# Patient Record
Sex: Male | Born: 1980 | Race: White | Hispanic: No | Marital: Married | Smoking: Former smoker
Health system: Southern US, Community
[De-identification: ages and names within clinical notes are randomized; demographics above are authoritative.]

## PROBLEM LIST (undated history)

## (undated) DIAGNOSIS — G4730 Sleep apnea, unspecified: Secondary | ICD-10-CM

## (undated) HISTORY — PX: ANKLE SURGERY: SHX546

## (undated) HISTORY — DX: Sleep apnea, unspecified: G47.30

---

## 2005-01-17 ENCOUNTER — Ambulatory Visit: Payer: Self-pay | Admitting: Unknown Physician Specialty

## 2006-11-19 ENCOUNTER — Emergency Department: Payer: Self-pay | Admitting: Emergency Medicine

## 2011-06-12 ENCOUNTER — Emergency Department: Payer: Self-pay

## 2015-10-16 ENCOUNTER — Encounter: Payer: Self-pay | Admitting: Emergency Medicine

## 2015-10-16 ENCOUNTER — Emergency Department: Payer: Managed Care, Other (non HMO)

## 2015-10-16 ENCOUNTER — Emergency Department
Admission: EM | Admit: 2015-10-16 | Discharge: 2015-10-16 | Disposition: A | Discharge status: Home / Self Care | Payer: Managed Care, Other (non HMO) | Attending: Emergency Medicine | Admitting: Emergency Medicine

## 2015-10-16 DIAGNOSIS — S20212A Contusion of left front wall of thorax, initial encounter: Secondary | ICD-10-CM | POA: Insufficient documentation

## 2015-10-16 DIAGNOSIS — Y998 Other external cause status: Secondary | ICD-10-CM | POA: Insufficient documentation

## 2015-10-16 DIAGNOSIS — Y9289 Other specified places as the place of occurrence of the external cause: Secondary | ICD-10-CM | POA: Diagnosis not present

## 2015-10-16 DIAGNOSIS — S29001A Unspecified injury of muscle and tendon of front wall of thorax, initial encounter: Secondary | ICD-10-CM | POA: Diagnosis present

## 2015-10-16 DIAGNOSIS — F17200 Nicotine dependence, unspecified, uncomplicated: Secondary | ICD-10-CM | POA: Diagnosis not present

## 2015-10-16 DIAGNOSIS — S301XXA Contusion of abdominal wall, initial encounter: Secondary | ICD-10-CM | POA: Insufficient documentation

## 2015-10-16 DIAGNOSIS — Y9323 Activity, snow (alpine) (downhill) skiing, snow boarding, sledding, tobogganing and snow tubing: Secondary | ICD-10-CM | POA: Insufficient documentation

## 2015-10-16 MED ORDER — TRAMADOL HCL 50 MG PO TABS
50.0000 mg | ORAL_TABLET | Freq: Once | ORAL | Status: AC
  Administered 2015-10-16: 50 mg via ORAL
  Filled 2015-10-16: qty 1

## 2015-10-16 MED ORDER — IBUPROFEN 800 MG PO TABS
800.0000 mg | ORAL_TABLET | Freq: Once | ORAL | Status: AC
  Administered 2015-10-16: 800 mg via ORAL
  Filled 2015-10-16: qty 1

## 2015-10-16 MED ORDER — TRAMADOL HCL 50 MG PO TABS: 50.0000 mg | ORAL_TABLET | Freq: Four times a day (QID) | ORAL | Status: AC | PRN

## 2015-10-16 MED ORDER — IBUPROFEN 800 MG PO TABS: 800.0000 mg | ORAL_TABLET | Freq: Three times a day (TID) | ORAL | Status: AC | PRN

## 2015-10-16 NOTE — ED Provider Notes (Signed)
[facility] Emergency Department Provider Note  ____________________________________________  Time seen: Approximately 2:07 PM  I have reviewed the triage vital signs and the nursing notes.   HISTORY  Chief Complaint Fall    HPI Timothy Mendoza is a 35 y.o. male patient complaining the left frontal rib pain and left abdominal pain secondary to sledding incident yesterday. Patient has 2 episodes the first floor when he fell off a sled and the second one secondary to a twisting incident going up a hill. Patient states the pain increases with movement or deep breathing. Patient is noticed no bruising. Patient state on the second episode he felt a pop in the left rib area. No palliative measures taken for this complaint. Patient rates pain as a 7/10 at this time.   History reviewed. No pertinent past medical history.  There are no active problems to display for this patient.   History reviewed. No pertinent past surgical history.  Current Outpatient Rx  Name  Route  Sig  Dispense  Refill  . ibuprofen (ADVIL,MOTRIN) 800 MG tablet   Oral   Take 1 tablet (800 mg total) by mouth every 8 (eight) hours as needed.   30 tablet   0   . traMADol (ULTRAM) 50 MG tablet   Oral   Take 1 tablet (50 mg total) by mouth every 6 (six) hours as needed for moderate pain.   12 tablet   0     Allergies Review of patient's allergies indicates no known allergies.  No family history on file.  Social History Social History  Substance Use Topics  . Smoking status: Current Every Day Smoker  . Smokeless tobacco: None  . Alcohol Use: No    Review of Systems Constitutional: No fever/chills Eyes: No visual changes. ENT: No sore throat. Cardiovascular: Denies chest pain. Respiratory: Denies shortness of breath. Gastrointestinal: Left abdominal pain.  No nausea, no vomiting.  No diarrhea.  No constipation. Genitourinary: Negative for dysuria. Musculoskeletal: Left rib  pain . Skin: Negative for rash. Neurological: Negative for headaches, focal weakness or numbness. 10-point ROS otherwise negative.  ____________________________________________   PHYSICAL EXAM:  VITAL SIGNS: ED Triage Vitals  Enc Vitals Group     BP 10/16/15 1240 131/82 mmHg     Pulse Rate 10/16/15 1240 87     Resp 10/16/15 1240 18     Temp 10/16/15 1240 97.9 F (36.6 C)     Temp Source 10/16/15 1240 Oral     SpO2 10/16/15 1240 100 %     Weight 10/16/15 1240 185 lb (83.915 kg)     Height 10/16/15 1240 5\' 11"  (1.803 m)     Head Cir --      Peak Flow --      Pain Score 10/16/15 1205 7     Pain Loc --      Pain Edu? --      Excl. in GC? --     Constitutional: Alert and oriented. Well appearing and in no acute distress. Eyes: Conjunctivae are normal. PERRL. EOMI. Head: Atraumatic. Nose: No congestion/rhinnorhea. Mouth/Throat: Mucous membranes are moist.  Oropharynx non-erythematous. Neck: No stridor. No cervical spine tenderness to palpation. Hematological/Lymphatic/Immunilogical: No cervical lymphadenopathy. Cardiovascular: Normal rate, regular rhythm. Grossly normal heart sounds.  Good peripheral circulation. Respiratory: Normal respiratory effort.  No retractions. Lungs CTAB. Gastrointestinal guarding palpation left upper quadrant of the abdomen. No distention. No abdominal bruits. No CVA tenderness. Musculoskeletal: Guarding palpation 10th and 12th ribs on the left side.Marland Kitchen  No joint effusions. Neurologic:  Normal speech and language. No gross focal neurologic deficits are appreciated. No gait instability. Skin:  Skin is warm, dry and intact. No rash noted. Psychiatric: Mood and affect are normal. Speech and behavior are normal.  ____________________________________________   LABS (all labs ordered are listed, but only abnormal results are displayed)  Labs Reviewed - No data to  display ____________________________________________  EKG   ____________________________________________  RADIOLOGY  No acute findings on x-ray or ultrasound. ____________________________________________   PROCEDURES  Procedure(s) performed: None  Critical Care performed: No  ____________________________________________   INITIAL IMPRESSION / ASSESSMENT AND PLAN / ED COURSE  Pertinent labs & imaging results that were available during my care of the patient were reviewed by me and considered in my medical decision making (see chart for details).  Left rib contusion left abdomen contusion. Discussed x-ray  negative results with patient. Patient given discharge Instructions and a Prescription for Tramadol and Ibuprofen. Patient Advised Follow-Up Family Doctor If Complaint Continues. ____________________________________________   FINAL CLINICAL IMPRESSION(S) / ED DIAGNOSES  Final diagnoses:  Rib contusion, left, initial encounter  Abdominal contusion, initial encounter      Joni ReiningRonald K Almena Hokenson, PA-C 10/16/15 1521  Emily FilbertJonathan E Williams, MD 10/16/15 [phone]

## 2015-10-16 NOTE — ED Notes (Signed)
Pt with left rib pain. 0/10 when sitting still, 8/10 with movement or breathing, coughing. No bruising.

## 2015-10-16 NOTE — ED Notes (Signed)
Pt to ed with c/o fall yesterday on ice,  Pt states he felt a pop in left rib area.  Increased pain with movement today.

## 2015-10-16 NOTE — Discharge Instructions (Signed)
Chest Contusion °A contusion is a deep bruise. Bruises happen when an injury causes bleeding under the skin. Signs of bruising include pain, puffiness (swelling), and discolored skin. The bruise may turn blue, purple, or yellow.  °HOME CARE °· Put ice on the injured area. °¨ Put ice in a plastic bag. °¨ Place a towel between the skin and the bag. °¨ Leave the ice on for 15-20 minutes at a time, 03-04 times a day for the first 48 hours. °· Only take medicine as told by your doctor. °· Rest. °· Take deep breaths (deep-breathing exercises) as told by your doctor. °· Stop smoking if you smoke. °· Do not lift objects over 5 pounds (2.3 kilograms) for 3 days or longer if told by your doctor. °GET HELP RIGHT AWAY IF:  °· You have more bruising or puffiness. °· You have pain that gets worse. °· You have trouble breathing. °· You are dizzy, weak, or pass out (faint). °· You have blood in your pee (urine) or poop (stool). °· You cough up or throw up (vomit) blood. °· Your puffiness or pain is not helped with medicines. °MAKE SURE YOU:  °· Understand these instructions. °· Will watch your condition. °· Will get help right away if you are not doing well or get worse. °  °This information is not intended to replace advice given to you by your health care provider. Make sure you discuss any questions you have with your health care provider. °  °Document Released: 03/11/2008 Document Revised: 06/17/2012 Document Reviewed: 03/16/2012 °Elsevier Interactive Patient Education ©2016 [employer] ° °

## 2016-01-19 ENCOUNTER — Ambulatory Visit: Payer: Self-pay | Admitting: Family Medicine

## 2016-03-20 ENCOUNTER — Ambulatory Visit: Payer: Self-pay | Admitting: Family Medicine

## 2016-12-19 IMAGING — CR DG RIBS W/ CHEST 3+V*L*
3 series · 3 of 3 positions shown · non-contrast
Comparison: None.

CLINICAL DATA: Left rib pain, fell snowboarding yesterday

EXAM:
LEFT RIBS AND CHEST - 3+ VIEW

[chest pa]
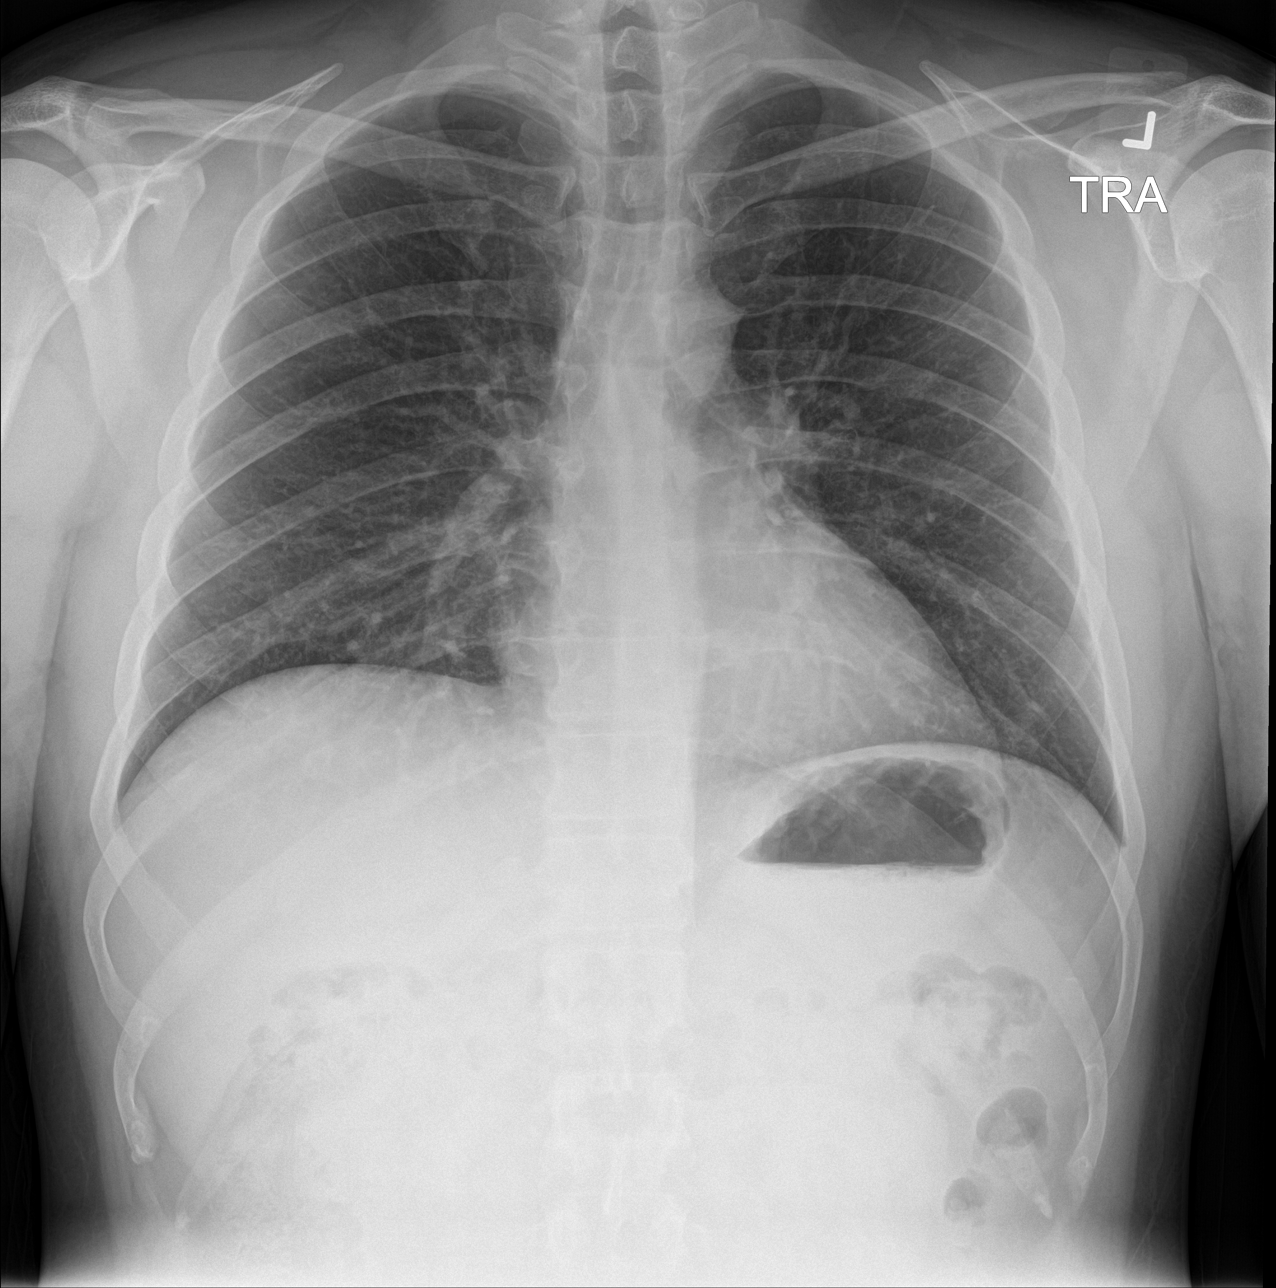

[rib pa]
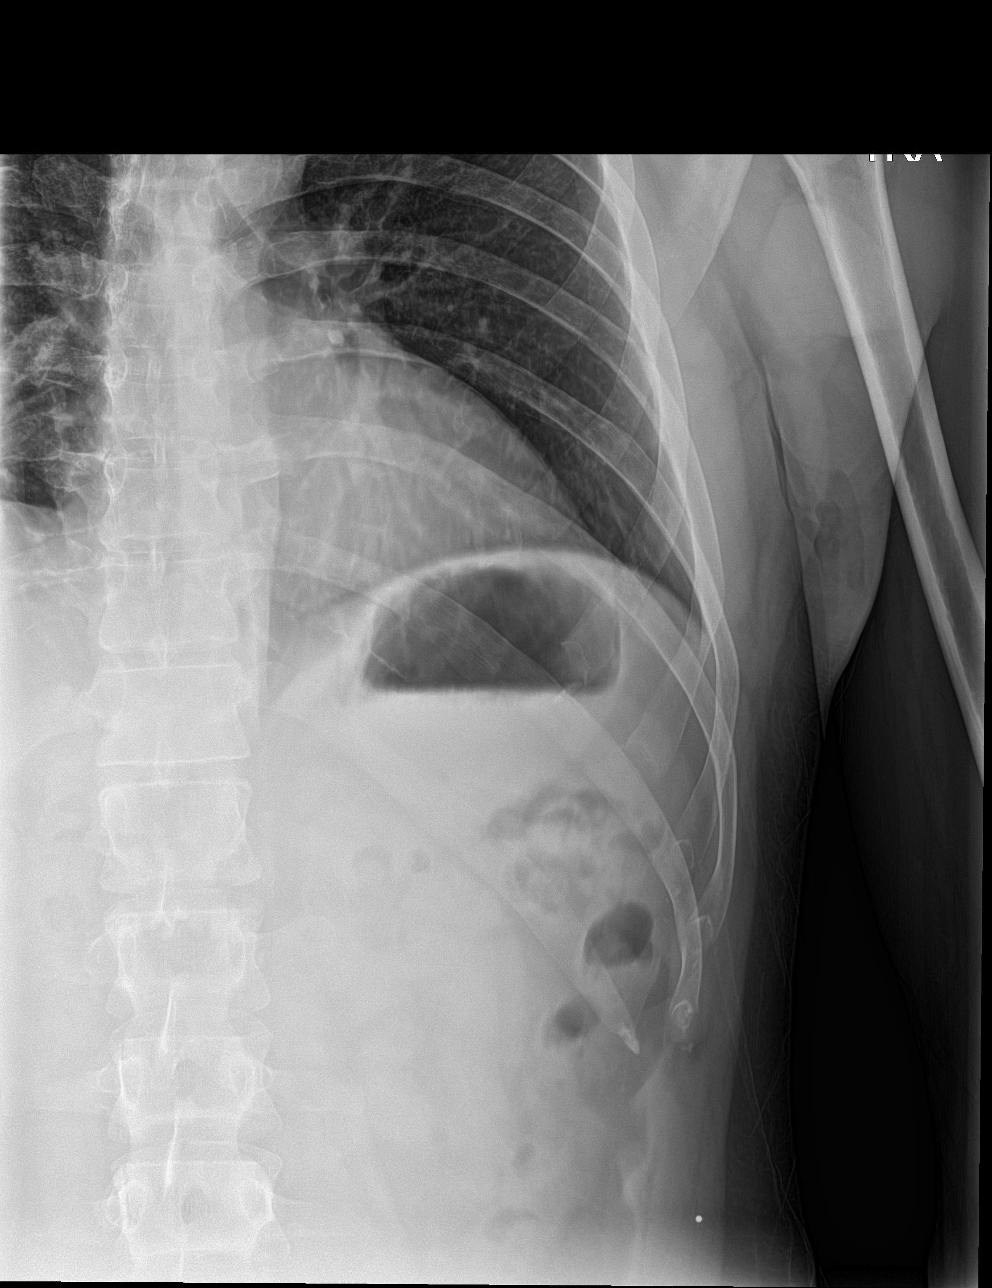

[rib pa obl]
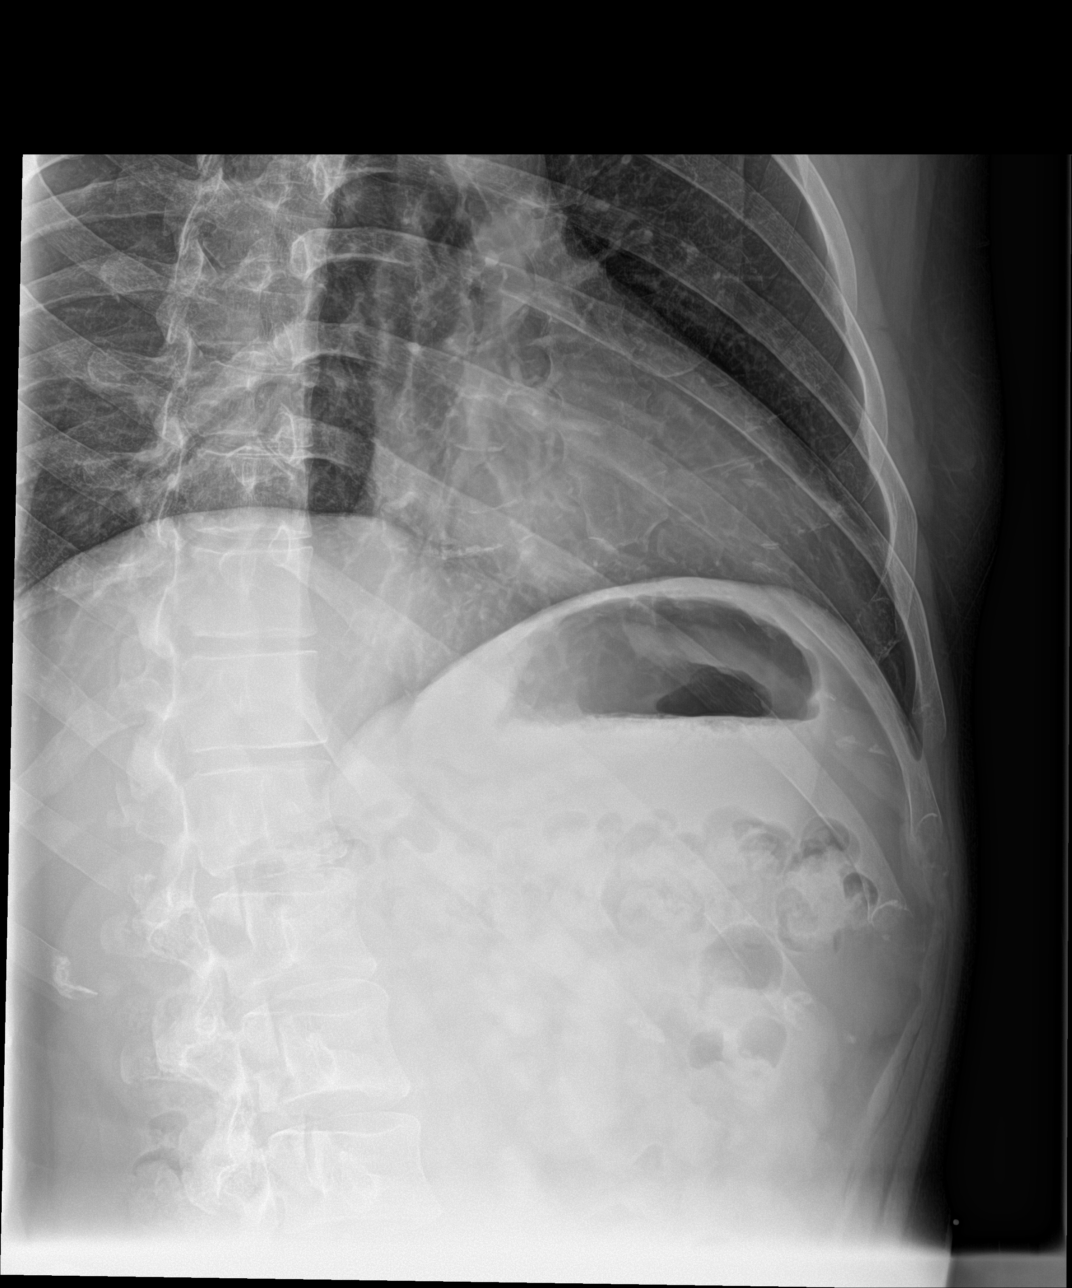

[3 of 3 positions shown; findings below may reference images not displayed]

FINDINGS: Three views of the left ribs submitted. No acute infiltrate or
pulmonary edema. No rib fracture is identified. There is no
pneumothorax.
IMPRESSION: Negative.

## 2019-09-28 ENCOUNTER — Encounter: Payer: Self-pay | Admitting: Emergency Medicine

## 2019-09-28 ENCOUNTER — Ambulatory Visit
Admission: EM | Admit: 2019-09-28 | Discharge: 2019-09-28 | Disposition: A | Discharge status: Home / Self Care | Payer: 59 | Attending: Emergency Medicine | Admitting: Emergency Medicine

## 2019-09-28 ENCOUNTER — Other Ambulatory Visit: Payer: Self-pay

## 2019-09-28 DIAGNOSIS — F17210 Nicotine dependence, cigarettes, uncomplicated: Secondary | ICD-10-CM | POA: Diagnosis not present

## 2019-09-28 DIAGNOSIS — R509 Fever, unspecified: Secondary | ICD-10-CM

## 2019-09-28 DIAGNOSIS — M791 Myalgia, unspecified site: Secondary | ICD-10-CM

## 2019-09-28 DIAGNOSIS — U071 COVID-19: Secondary | ICD-10-CM | POA: Diagnosis not present

## 2019-09-28 LAB — POC SARS CORONAVIRUS 2 AG -  ED: SARS Coronavirus 2 Ag: POSITIVE — AB

## 2019-09-28 NOTE — ED Triage Notes (Signed)
Patient in office today c/o chills fever and body ache after coming home from work. Stated that someone in his employment tested positive for covid  OTC: [place]

## 2019-09-28 NOTE — Discharge Instructions (Addendum)
Your COVID test is positive.    Take Tylenol as needed for fever or discomfort.    You should self-quarantine for:  *10 days since your symptoms first appeared and  *24 hours with no fever, without the use of fever-reducing medications and  *your other symptoms of COVID are improving.  Most people do not need to be re-tested at the end of the quarantine period.    Go to the emergency department if you have high fever not relieved by Tylenol, shortness of breath, severe diarrhea, or other concerning symptoms.

## 2019-09-28 NOTE — ED Provider Notes (Signed)
[facility]    CSN: [id number] Arrival date & time: 09/28/19  1051      History   Chief Complaint Chief Complaint  Patient presents with  . chills, fever and body ache    HPI Timothy Mendoza is a 38 y.o. male.   Patient presents with 1 day history of fever, chills, body aches.  T-max 102.6.  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  He denies sore throat, congestion, cough, shortness of breath, vomiting, diarrhea, rash, or other symptoms.  He treated his fever at home with ibuprofen.     The history is provided by the patient.    History reviewed. No pertinent past medical history.  There are no problems to display for this patient.   History reviewed. No pertinent surgical history.     Home Medications    Prior to Admission medications   Medication Sig Start Date End Date Taking? Authorizing Provider  ibuprofen (ADVIL,MOTRIN) 800 MG tablet Take 1 tablet (800 mg total) by mouth every 8 (eight) hours as needed. 10/16/15   Joni ReiningSmith, Ronald K, PA-C  traMADol (ULTRAM) 50 MG tablet Take 1 tablet (50 mg total) by mouth every 6 (six) hours as needed for moderate pain. 10/16/15   Joni ReiningSmith, Ronald K, PA-C    Family History History reviewed. No pertinent family history.  Social History Social History   Tobacco Use  . Smoking status: Current Every Day Smoker  . Smokeless tobacco: Never Used  Substance Use Topics  . Alcohol use: Yes  . Drug use: No     Allergies   Patient has no known allergies.   Review of Systems Review of Systems  Constitutional: Positive for chills and fever.  HENT: Negative for congestion, ear pain, rhinorrhea and sore throat.   Eyes: Negative for pain and visual disturbance.  Respiratory: Negative for cough and shortness of breath.   Cardiovascular: Negative for chest pain and palpitations.  Gastrointestinal: Negative for abdominal pain, diarrhea, nausea and vomiting.  Genitourinary: Negative for dysuria and  hematuria.  Musculoskeletal: Negative for arthralgias and back pain.  Skin: Negative for color change and rash.  Neurological: Negative for seizures and syncope.  All other systems reviewed and are negative.    Physical Exam Triage Vital Signs ED Triage Vitals  Enc Vitals Group     BP 09/28/19 1100 (!) 129/91     Pulse Rate 09/28/19 1100 (!) 105     Resp 09/28/19 1100 18     Temp 09/28/19 1100 99.7 F (37.6 C)     Temp src --      SpO2 09/28/19 1100 98 %     Weight 09/28/19 1103 192 lb (87.1 kg)     Height --      Head Circumference --      Peak Flow --      Pain Score 09/28/19 1103 0     Pain Loc --      Pain Edu? --      Excl. in GC? --    No data found.  Updated Vital Signs BP (!) 129/91 (BP Location: Left Arm)   Pulse (!) 105   Temp 99.7 F (37.6 C)   Resp 18   Wt 192 lb (87.1 kg)   SpO2 98%   BMI 26.78 kg/m   Visual Acuity Right Eye Distance:   Left Eye Distance:   Bilateral Distance:    Right Eye Near:   Left Eye Near:  Bilateral Near:     Physical Exam Vitals and nursing note reviewed.  Constitutional:      General: He is not in acute distress.    Appearance: He is well-developed. He is not ill-appearing.  HENT:     Head: Normocephalic and atraumatic.     Right Ear: Tympanic membrane normal.     Left Ear: Tympanic membrane normal.     Nose: Nose normal.     Mouth/Throat:     Mouth: Mucous membranes are moist.     Pharynx: Oropharynx is clear.  Eyes:     Conjunctiva/sclera: Conjunctivae normal.  Cardiovascular:     Rate and Rhythm: Normal rate and regular rhythm.     Heart sounds: No murmur.  Pulmonary:     Effort: Pulmonary effort is normal. No respiratory distress.     Breath sounds: Normal breath sounds.  Abdominal:     General: Bowel sounds are normal.     Palpations: Abdomen is soft.     Tenderness: There is no abdominal tenderness. There is no guarding or rebound.  Musculoskeletal:     Cervical back: Neck supple.  Skin:     General: Skin is warm and dry.     Findings: No rash.  Neurological:     General: No focal deficit present.     Mental Status: He is alert and oriented to person, place, and time.  Psychiatric:        Mood and Affect: Mood normal.        Behavior: Behavior normal.      UC Treatments / Results  Labs (all labs ordered are listed, but only abnormal results are displayed) Labs Reviewed  POC SARS CORONAVIRUS 2 AG -  ED - Abnormal; Notable for the following components:      Result Value   SARS Coronavirus 2 Ag Positive (*)    All other components within normal limits    EKG   Radiology No results found.  Procedures Procedures (including critical care time)  Medications Ordered in UC Medications - No data to display  Initial Impression / Assessment and Plan / UC Course  I have reviewed the triage vital signs and the nursing notes.  Pertinent labs & imaging results that were available during my care of the patient were reviewed by me and considered in my medical decision making (see chart for details).   COVID-19.  POC COVID positive.  Instructed patient to take Tylenol as needed for fever or discomfort.  Instructed patient to quarantine per CDC guidelines.  Instructed him to go to the emergency department if he has high fever not relieved by Tylenol, shortness of breath, severe diarrhea, or other concerning symptoms.  Patient agrees to plan of care.     Final Clinical Impressions(s) / UC Diagnoses   Final diagnoses:  Fever, unspecified  COVID-19     Discharge Instructions     Your COVID test is positive.    Take Tylenol as needed for fever or discomfort.    You should self-quarantine for:  *10 days since your symptoms first appeared and  *24 hours with no fever, without the use of fever-reducing medications and  *your other symptoms of COVID are improving.  Most people do not need to be re-tested at the end of the quarantine period.    Go to the emergency  department if you have high fever not relieved by Tylenol, shortness of breath, severe diarrhea, or other concerning symptoms.  ED Prescriptions    None     PDMP not reviewed this encounter.   Sharion Balloon, NP 09/28/19 1144

## 2021-07-09 ENCOUNTER — Ambulatory Visit: Payer: BC Managed Care – PPO | Admitting: Internal Medicine

## 2021-07-09 ENCOUNTER — Encounter: Payer: Self-pay | Admitting: Internal Medicine

## 2021-07-09 ENCOUNTER — Other Ambulatory Visit: Payer: Self-pay

## 2021-07-09 VITALS — BP 130/92 | HR 80 | Ht 71.0 in | Wt 191.2 lb

## 2021-07-09 DIAGNOSIS — G4733 Obstructive sleep apnea (adult) (pediatric): Secondary | ICD-10-CM | POA: Diagnosis not present

## 2021-07-09 DIAGNOSIS — R030 Elevated blood-pressure reading, without diagnosis of hypertension: Secondary | ICD-10-CM

## 2021-07-09 NOTE — Assessment & Plan Note (Signed)
We will schedule the patient for sleep study

## 2021-07-09 NOTE — Assessment & Plan Note (Signed)

## 2021-07-09 NOTE — Progress Notes (Signed)
New Patient Office Visit  Subjective:  Patient ID: Timothy Mendoza, male    DOB: 08/03/1981  Age: 40 y.o. MRN: [id number]  CC:  Chief Complaint  Patient presents with   New Patient (Initial Visit)    Patient would like to establish care and would like to discuss his sleep apnea.    Insomnia Primary symptoms: sleep disturbance, difficulty falling asleep, somnolence, no frequent awakening, malaise/fatigue.   The problem occurs nightly. The symptoms are aggravated by anxiety and alcohol. How many beverages per day that contain caffeine: 0 - 1.  Types of beverages you drink: coffee. PMH includes: no hypertension, no depression, no family stress or anxiety, restless leg syndrome, work related stressors, no chronic pain.   Patient presents for sleep disorder  Past Medical History:  Diagnosis Date   Sleep apnea      Current Outpatient Medications:    ibuprofen (ADVIL,MOTRIN) 800 MG tablet, Take 1 tablet (800 mg total) by mouth every 8 (eight) hours as needed., Disp: 30 tablet, Rfl: 0   traMADol (ULTRAM) 50 MG tablet, Take 1 tablet (50 mg total) by mouth every 6 (six) hours as needed for moderate pain., Disp: 12 tablet, Rfl: 0   Past Surgical History:  Procedure Laterality Date   ANKLE SURGERY      Family History  Problem Relation Age of Onset   Neuropathy Mother    Diabetes Mother    Hypertension Father    Stroke Father     Social History   Socioeconomic History   Marital status: Married    Spouse name: Not on file   Number of children: Not on file   Years of education: Not on file   Highest education level: Not on file  Occupational History   Not on file  Tobacco Use   Smoking status: Former    Types: Cigarettes   Smokeless tobacco: Never  Vaping Use   Vaping Use: Every day  Substance and Sexual Activity   Alcohol use: Yes    Comment: weekend use   Drug use: No   Sexual activity: Yes  Other Topics Concern   Not on file  Social History Narrative   Not on  file   Social Determinants of Health   Financial Resource Strain: Not on file  Food Insecurity: Not on file  Transportation Needs: Not on file  Physical Activity: Not on file  Stress: Not on file  Social Connections: Not on file  Intimate Partner Violence: Not on file    ROS Review of Systems  Constitutional:  Positive for malaise/fatigue.  HENT: Negative.    Eyes: Negative.   Respiratory: Negative.    Cardiovascular: Negative.   Gastrointestinal: Negative.   Endocrine: Negative.   Genitourinary: Negative.   Musculoskeletal: Negative.   Skin: Negative.   Allergic/Immunologic: Negative.   Neurological: Negative.   Hematological: Negative.   Psychiatric/Behavioral:  Positive for sleep disturbance. Negative for depression. The patient has insomnia.   All other systems reviewed and are negative.  Objective:   Today's Vitals: BP (!) 130/92   Pulse 80   Ht 5\' 11"  (1.803 m)   Wt 191 lb 3.2 oz (86.7 kg)   BMI 26.67 kg/m   Physical Exam Constitutional:      Appearance: Normal appearance.  HENT:     Head: Normocephalic and atraumatic.     Mouth/Throat:     Mouth: Mucous membranes are moist.  Eyes:     Pupils: Pupils are equal, round, and reactive to  light.  Neck:     Vascular: No carotid bruit.  Musculoskeletal:        General: Normal range of motion.     Cervical back: Normal range of motion.  Skin:    General: Skin is warm.  Neurological:     General: No focal deficit present.     Mental Status: He is alert.  Psychiatric:        Mood and Affect: Mood normal.    Assessment & Plan:   Problem List Items Addressed This Visit       Respiratory   OSA (obstructive sleep apnea) - Primary    We will schedule the patient for sleep study        Other   Prehypertension     Patient denies any chest pain or shortness of breath there is no history of palpitation or paroxysmal nocturnal dyspnea   patient was advised to follow low-salt low-cholesterol diet     ideally I want to keep systolic blood pressure below 382 mmHg, patient was asked to check blood pressure one times a week and give me a report on that.  Patient will be follow-up in 3 months  or earlier as needed, patient will call me back for any change in the cardiovascular symptoms Patient was advised to buy a book from local bookstore concerning blood pressure and read several chapters  every day.  This will be supplemented by some of the material we will give him from the office.  Patient should also utilize other resources like YouTube and Internet to learn more about the blood pressure and the diet.       Outpatient Encounter Medications as of 07/09/2021  Medication Sig   ibuprofen (ADVIL,MOTRIN) 800 MG tablet Take 1 tablet (800 mg total) by mouth every 8 (eight) hours as needed.   traMADol (ULTRAM) 50 MG tablet Take 1 tablet (50 mg total) by mouth every 6 (six) hours as needed for moderate pain.   No facility-administered encounter medications on file as of 07/09/2021.    Follow-up: 1 month  Corky Downs, MD

## 2021-07-10 LAB — COMPLETE METABOLIC PANEL WITH GFR
AG Ratio: 1.6 (calc) (ref 1.0–2.5)
ALT: 41 U/L (ref 9–46)
AST: 28 U/L (ref 10–40)
Albumin: 4.6 g/dL (ref 3.6–5.1)
Alkaline phosphatase (APISO): 59 U/L (ref 36–130)
BUN: 11 mg/dL (ref 7–25)
CO2: 24 mmol/L (ref 20–32)
Calcium: 9.7 mg/dL (ref 8.6–10.3)
Chloride: 104 mmol/L (ref 98–110)
Creat: 1.07 mg/dL (ref 0.60–1.29)
Globulin: 2.9 g/dL (calc) (ref 1.9–3.7)
Glucose, Bld: 104 mg/dL — ABNORMAL HIGH (ref 65–99)
Potassium: 4.5 mmol/L (ref 3.5–5.3)
Sodium: 140 mmol/L (ref 135–146)
Total Bilirubin: 0.5 mg/dL (ref 0.2–1.2)
Total Protein: 7.5 g/dL (ref 6.1–8.1)
eGFR: 90 mL/min/{1.73_m2} (ref >=60)

## 2021-07-10 LAB — CBC WITH DIFFERENTIAL/PLATELET
Absolute Monocytes: 714 cells/uL (ref 200–950)
Basophils Absolute: 43 cells/uL (ref 0–200)
Basophils Relative: 0.5 %
Eosinophils Absolute: 163 cells/uL (ref 15–500)
Eosinophils Relative: 1.9 %
HCT: 45.9 % (ref 38.5–50.0)
Hemoglobin: 15.9 g/dL (ref 13.2–17.1)
Lymphs Abs: 2348 cells/uL (ref 850–3900)
MCH: 30.1 pg (ref 27.0–33.0)
MCHC: 34.6 g/dL (ref 32.0–36.0)
MCV: 86.9 fL (ref 80.0–100.0)
MPV: 11.6 fL (ref 7.5–12.5)
Monocytes Relative: 8.3 %
Neutro Abs: 5332 cells/uL (ref 1500–7800)
Neutrophils Relative %: 62 %
Platelets: 283 10*3/uL (ref 140–400)
RBC: 5.28 10*6/uL (ref 4.20–5.80)
RDW: 13.4 % (ref 11.0–15.0)
Total Lymphocyte: 27.3 %
WBC: 8.6 10*3/uL (ref 3.8–10.8)

## 2021-07-10 LAB — LIPID PANEL
Cholesterol: 205 mg/dL — ABNORMAL HIGH (ref <200)
HDL: 46 mg/dL (ref >=40)
LDL Cholesterol (Calc): 128 mg/dL (calc) — ABNORMAL HIGH
Non-HDL Cholesterol (Calc): 159 mg/dL (calc) — ABNORMAL HIGH (ref <130)
Total CHOL/HDL Ratio: 4.5 (calc) (ref <5.0)
Triglycerides: 192 mg/dL — ABNORMAL HIGH (ref <150)

## 2021-07-10 LAB — TSH: TSH: 1.03 mIU/L (ref 0.40–4.50)

## 2021-08-14 DIAGNOSIS — G4733 Obstructive sleep apnea (adult) (pediatric): Secondary | ICD-10-CM | POA: Diagnosis not present

## 2021-08-21 DIAGNOSIS — G4733 Obstructive sleep apnea (adult) (pediatric): Secondary | ICD-10-CM | POA: Diagnosis not present

## 2021-09-04 ENCOUNTER — Encounter: Payer: Self-pay | Admitting: Internal Medicine

## 2021-09-12 ENCOUNTER — Other Ambulatory Visit: Payer: Self-pay

## 2021-09-14 DIAGNOSIS — G4733 Obstructive sleep apnea (adult) (pediatric): Secondary | ICD-10-CM | POA: Diagnosis not present

## 2021-10-04 ENCOUNTER — Other Ambulatory Visit: Payer: Self-pay | Admitting: Internal Medicine

## 2021-10-15 DIAGNOSIS — G4733 Obstructive sleep apnea (adult) (pediatric): Secondary | ICD-10-CM | POA: Diagnosis not present

## 2021-11-15 DIAGNOSIS — G4733 Obstructive sleep apnea (adult) (pediatric): Secondary | ICD-10-CM | POA: Diagnosis not present

## 2021-12-13 DIAGNOSIS — G4733 Obstructive sleep apnea (adult) (pediatric): Secondary | ICD-10-CM | POA: Diagnosis not present

## 2022-01-13 DIAGNOSIS — G4733 Obstructive sleep apnea (adult) (pediatric): Secondary | ICD-10-CM | POA: Diagnosis not present

## 2022-02-12 DIAGNOSIS — G4733 Obstructive sleep apnea (adult) (pediatric): Secondary | ICD-10-CM | POA: Diagnosis not present

## 2022-02-19 ENCOUNTER — Other Ambulatory Visit: Payer: Self-pay

## 2022-03-15 DIAGNOSIS — G4733 Obstructive sleep apnea (adult) (pediatric): Secondary | ICD-10-CM | POA: Diagnosis not present

## 2022-04-14 DIAGNOSIS — G4733 Obstructive sleep apnea (adult) (pediatric): Secondary | ICD-10-CM | POA: Diagnosis not present

## 2022-05-07 ENCOUNTER — Ambulatory Visit
Admission: EM | Admit: 2022-05-07 | Discharge: 2022-05-07 | Disposition: A | Discharge status: Home / Self Care | Payer: BC Managed Care – PPO | Attending: Emergency Medicine | Admitting: Emergency Medicine

## 2022-05-07 ENCOUNTER — Encounter: Payer: Self-pay | Admitting: Emergency Medicine

## 2022-05-07 ENCOUNTER — Other Ambulatory Visit: Payer: Self-pay

## 2022-05-07 DIAGNOSIS — J029 Acute pharyngitis, unspecified: Secondary | ICD-10-CM | POA: Diagnosis not present

## 2022-05-07 DIAGNOSIS — B349 Viral infection, unspecified: Secondary | ICD-10-CM | POA: Diagnosis not present

## 2022-05-07 LAB — POCT RAPID STREP A (OFFICE): Rapid Strep A Screen: NEGATIVE

## 2022-05-07 MED ORDER — AMOXICILLIN 500 MG PO CAPS: 500.0000 mg | ORAL_CAPSULE | Freq: Two times a day (BID) | ORAL | 0 refills | Status: AC

## 2022-05-07 NOTE — ED Triage Notes (Signed)
Body aches and feeling feverish started Sunday.  Monday, added sinus drainage and runny nose.  Last night started with sore throat, "feeling like shards of glass"

## 2022-05-07 NOTE — ED Provider Notes (Signed)
Renaldo Fiddler    CSN: [id number] Arrival date & time: 05/07/22  1217      History   Chief Complaint Chief Complaint  Patient presents with   Sore Throat    HPI GRAYSIN LUCZYNSKI is a 41 y.o. male.   Patient presents with nasal congestion and fever beginning 2 days ago, sore throat described as sharp denies beginning 1 day ago.  No known sick contacts.  Tolerating food and liquids but has not eaten today.  Denies ear pain coughing, shortness of breath, wheezing.  Past Medical History:  Diagnosis Date   Sleep apnea     Patient Active Problem List   Diagnosis Date Noted   OSA (obstructive sleep apnea) 07/09/2021   Prehypertension 07/09/2021    Past Surgical History:  Procedure Laterality Date   ANKLE SURGERY         Home Medications    Prior to Admission medications   Medication Sig Start Date End Date Taking? Authorizing Provider  amoxicillin (AMOXIL) 500 MG capsule Take 1 capsule (500 mg total) by mouth 2 (two) times daily for 7 days. 05/07/22 05/14/22 Yes Earnest Mcgillis R, NP  ibuprofen (ADVIL,MOTRIN) 800 MG tablet Take 1 tablet (800 mg total) by mouth every 8 (eight) hours as needed. 10/16/15  Yes Joni Reining, PA-C  traMADol (ULTRAM) 50 MG tablet Take 1 tablet (50 mg total) by mouth every 6 (six) hours as needed for moderate pain. Patient not taking: Reported on 05/07/2022 10/16/15   Joni Reining, PA-C    Family History Family History  Problem Relation Age of Onset   Neuropathy Mother    Diabetes Mother    Hypertension Father    Stroke Father     Social History Social History   Tobacco Use   Smoking status: Former    Types: Cigarettes   Smokeless tobacco: Never  Vaping Use   Vaping Use: Every day  Substance Use Topics   Alcohol use: Yes    Comment: weekend use   Drug use: No     Allergies   Patient has no known allergies.   Review of Systems Review of Systems Defer to hpi    Physical Exam Triage Vital Signs ED Triage Vitals   Enc Vitals Group     BP 05/07/22 1239 (!) 144/77     Pulse Rate 05/07/22 1239 (!) 105     Resp 05/07/22 1239 18     Temp 05/07/22 1239 100.3 F (37.9 C)     Temp Source 05/07/22 1239 Oral     SpO2 05/07/22 1239 95 %     Weight --      Height --      Head Circumference --      Peak Flow --      Pain Score 05/07/22 1254 8     Pain Loc --      Pain Edu? --      Excl. in GC? --    No data found.  Updated Vital Signs BP (!) 144/77 (BP Location: Left Arm)   Pulse (!) 105   Temp 100.3 F (37.9 C) (Oral)   Resp 18   SpO2 95%   Visual Acuity Right Eye Distance:   Left Eye Distance:   Bilateral Distance:    Right Eye Near:   Left Eye Near:    Bilateral Near:     Physical Exam Constitutional:      Appearance: He is well-developed.  HENT:  Head: Normocephalic.     Right Ear: Tympanic membrane and ear canal normal.     Left Ear: Tympanic membrane and ear canal normal.     Nose: Congestion present.     Mouth/Throat:     Mouth: Mucous membranes are moist.     Pharynx: Posterior oropharyngeal erythema present.     Tonsils: No tonsillar exudate. 0 on the right. 0 on the left.  Cardiovascular:     Rate and Rhythm: Normal rate and regular rhythm.     Heart sounds: Normal heart sounds.  Pulmonary:     Effort: Pulmonary effort is normal.     Breath sounds: Normal breath sounds.  Musculoskeletal:     Cervical back: Normal range of motion and neck supple.  Skin:    General: Skin is warm and dry.  Neurological:     Mental Status: He is alert.      UC Treatments / Results  Labs (all labs ordered are listed, but only abnormal results are displayed) Labs Reviewed  CULTURE, GROUP A STREP [facility])  POCT RAPID STREP A (OFFICE)    EKG   Radiology No results found.  Procedures Procedures (including critical care time)  Medications Ordered in UC Medications - No data to display  Initial Impression / Assessment and Plan / UC Course  I have reviewed the triage  vital signs and the nursing notes.  Pertinent labs & imaging results that were available during my care of the patient were reviewed by me and considered in my medical decision making (see chart for details).  Sore throat, viral illness fever of 100.3 with associated tachycardia noted in, mild erythema without exudate or tonsillar adenopathy noted on exam, rapid strep test negative, sent for culture, discussed findings with patient, recommend over-the-counter measures and supportive care for treatment, watchful wait antibiotic placed for day 8 of illness if no improvement seen,  may follow-up with this urgent care as needed Final Clinical Impressions(s) / UC Diagnoses   Final diagnoses:  Sore throat  Viral illness     Discharge Instructions      Your symptoms today are most likely being caused by a virus and should steadily improve in time it can take up to 7 to 10 days before you truly start to see a turnaround however things will get better  If you have not seen any improvement by day 7 of your illness, a 7-day course of amoxicillin will be waiting at the pharmacy for you on Sunday, May 12, 2022    You can take Tylenol and/or Ibuprofen as needed for fever reduction and pain relief.   For cough: honey 1/2 to 1 teaspoon (you can dilute the honey in water or another fluid).  You can also use guaifenesin and dextromethorphan for cough. You can use a humidifier for chest congestion and cough.  If you don't have a humidifier, you can sit in the bathroom with the hot shower running.      For sore throat: try warm salt water gargles, cepacol lozenges, throat spray, warm tea or water with lemon/honey, popsicles or ice, or OTC cold relief medicine for throat discomfort.   For congestion: take a daily anti-histamine like Zyrtec, Claritin, and a oral decongestant, such as pseudoephedrine.  You can also use Flonase 1-2 sprays in each nostril daily.   It is important to stay hydrated: drink plenty  of fluids (water, gatorade/powerade/pedialyte, juices, or teas) to keep your throat moisturized and help further relieve irritation/discomfort.  ED Prescriptions     Medication Sig Dispense Auth. Provider   amoxicillin (AMOXIL) 500 MG capsule Take 1 capsule (500 mg total) by mouth 2 (two) times daily for 7 days. 14 capsule Adelee Hannula, Elita Boone, NP      PDMP not reviewed this encounter.   Valinda Hoar, NP 05/07/22 1400

## 2022-05-07 NOTE — Discharge Instructions (Addendum)
Your symptoms today are most likely being caused by a virus and should steadily improve in time it can take up to 7 to 10 days before you truly start to see a turnaround however things will get better  If you have not seen any improvement by day 7 of your illness, a 7-day course of amoxicillin will be waiting at the pharmacy for you on Sunday, May 12, 2022    You can take Tylenol and/or Ibuprofen as needed for fever reduction and pain relief.   For cough: honey 1/2 to 1 teaspoon (you can dilute the honey in water or another fluid).  You can also use guaifenesin and dextromethorphan for cough. You can use a humidifier for chest congestion and cough.  If you don't have a humidifier, you can sit in the bathroom with the hot shower running.      For sore throat: try warm salt water gargles, cepacol lozenges, throat spray, warm tea or water with lemon/honey, popsicles or ice, or OTC cold relief medicine for throat discomfort.   For congestion: take a daily anti-histamine like Zyrtec, Claritin, and a oral decongestant, such as pseudoephedrine.  You can also use Flonase 1-2 sprays in each nostril daily.   It is important to stay hydrated: drink plenty of fluids (water, gatorade/powerade/pedialyte, juices, or teas) to keep your throat moisturized and help further relieve irritation/discomfort.

## 2022-05-10 LAB — CULTURE, GROUP A STREP (THRC)

## 2022-05-15 DIAGNOSIS — G4733 Obstructive sleep apnea (adult) (pediatric): Secondary | ICD-10-CM | POA: Diagnosis not present
